# Patient Record
Sex: Female | Born: 1990 | Race: White | Hispanic: No | Marital: Single | Smoking: Never smoker
Health system: Southern US, Community
[De-identification: ages and names within clinical notes are randomized; demographics above are authoritative.]

## PROBLEM LIST (undated history)

## (undated) HISTORY — PX: TONSILLECTOMY: SUR1361

## (undated) HISTORY — PX: COLPOSCOPY: SHX161

---

## 2013-11-24 ENCOUNTER — Encounter (HOSPITAL_COMMUNITY): Payer: Self-pay | Admitting: Emergency Medicine

## 2013-11-24 ENCOUNTER — Emergency Department (HOSPITAL_COMMUNITY)
Admission: EM | Admit: 2013-11-24 | Discharge: 2013-11-24 | Disposition: A | Discharge status: Home / Self Care | Payer: Medicaid Other | Attending: Emergency Medicine | Admitting: Emergency Medicine

## 2013-11-24 DIAGNOSIS — L500 Allergic urticaria: Secondary | ICD-10-CM | POA: Insufficient documentation

## 2013-11-24 DIAGNOSIS — T4995XA Adverse effect of unspecified topical agent, initial encounter: Secondary | ICD-10-CM | POA: Insufficient documentation

## 2013-11-24 DIAGNOSIS — T7840XA Allergy, unspecified, initial encounter: Secondary | ICD-10-CM

## 2013-11-24 DIAGNOSIS — R21 Rash and other nonspecific skin eruption: Secondary | ICD-10-CM | POA: Insufficient documentation

## 2013-11-24 MED ORDER — PREDNISONE 20 MG PO TABS: 40.0000 mg | ORAL_TABLET | Freq: Every day | ORAL | Status: DC

## 2013-11-24 MED ORDER — FAMOTIDINE 40 MG PO TABS: 20.0000 mg | ORAL_TABLET | Freq: Two times a day (BID) | ORAL | Status: DC

## 2013-11-24 MED ORDER — FAMOTIDINE 20 MG PO TABS
20.0000 mg | ORAL_TABLET | Freq: Once | ORAL | Status: AC
  Administered 2013-11-24: 20 mg via ORAL
  Filled 2013-11-24: qty 1

## 2013-11-24 MED ORDER — METHYLPREDNISOLONE SODIUM SUCC 125 MG IJ SOLR
125.0000 mg | Freq: Once | INTRAMUSCULAR | Status: AC
  Administered 2013-11-24: 125 mg via INTRAMUSCULAR
  Filled 2013-11-24: qty 2

## 2013-11-24 MED ORDER — DIPHENHYDRAMINE HCL 50 MG/ML IJ SOLN
50.0000 mg | Freq: Once | INTRAMUSCULAR | Status: AC
  Administered 2013-11-24: 50 mg via INTRAMUSCULAR
  Filled 2013-11-24: qty 1

## 2013-11-24 MED ORDER — DIPHENHYDRAMINE HCL 25 MG PO TABS: 25.0000 mg | ORAL_TABLET | Freq: Four times a day (QID) | ORAL | Status: DC | PRN

## 2013-11-24 NOTE — ED Notes (Signed)
Pt reports rash that started 2 days ago. States she was seen at [facility] and was told it was scabies. States she has been using prescription with no relief. Denies pain, but reports extreme itching. Rash to neck, abdomen, arms and legs. NAD.

## 2013-11-24 NOTE — ED Provider Notes (Signed)
CSN: [id number]     Arrival date & time 11/24/13  2127 History  This chart was scribed for non-physician practitioner, Junious Silk, PA-C working with Layla Maw Ward, DO by Greggory Stallion, ED scribe. This patient was seen in room TR08C/TR08C and the patient's care was started at 10:33 PM.    Chief Complaint  Patient presents with  . Rash   The history is provided by the patient. No language interpreter was used.   HPI Comments: Mercedes Peterson is a 23 y.o. female who presents to the Emergency Department complaining of gradual onset , worsening, itchy rash that stared 2 days ago. States it is to her neck, arms, abdomen and legs. Pt states she was seen at [facility] and told it was scabies. Pt has been using the premethrin cream she was given with no relief. No one else at home has a similar rash. She states that her throat started to feel like it was closing up the morning after she started the cream. She does not currently have this feeling and she does not feel short of breath. Pt has also taken benadryl with no relief. Her last dose was yesterday night. Denies new soaps, detergents, pets. Pt has not slept in a new place. Denies trouble swallowing, difficulty breathing.   History reviewed. No pertinent past medical history. History reviewed. No pertinent past surgical history. No family history on file. History  Substance Use Topics  . Smoking status: Never Smoker   . Smokeless tobacco: Not on file  . Alcohol Use: No   OB History   Grav Para Term Preterm Abortions TAB SAB Ect Mult Living                 Review of Systems  HENT: Negative for trouble swallowing.   Skin: Positive for rash.  All other systems reviewed and are negative.  Allergies  Review of patient's allergies indicates no known allergies.  Home Medications   Prior to Admission medications   Not on File   BP 118/65  Pulse 77  Temp(Src) 98.6 F (37 C) (Oral)  Resp 20  SpO2 100%  Physical Exam  Nursing note and  vitals reviewed. Constitutional: She is oriented to person, place, and time. She appears well-developed and well-nourished. No distress.  HENT:  Head: Normocephalic and atraumatic.  Right Ear: External ear normal.  Left Ear: External ear normal.  Nose: Nose normal.  Mouth/Throat: Oropharynx is clear and moist.  Speaking in full sentences and maintaining own secretions. No uvula swelling.   Eyes: Conjunctivae are normal.  Neck: Normal range of motion.  Cardiovascular: Normal rate, regular rhythm and normal heart sounds.   Pulmonary/Chest: Effort normal and breath sounds normal. No stridor. No respiratory distress. She has no wheezes. She has no rales.  Abdominal: Soft. She exhibits no distension.  Musculoskeletal: Normal range of motion.  Neurological: She is alert and oriented to person, place, and time. She has normal strength.  Skin: Skin is warm and dry. Rash noted. She is not diaphoretic. No erythema.  Urticarial rash throughout upper extremities, abdomen, chest and back. No rashes in webspace of fingers.   Psychiatric: She has a normal mood and affect. Her behavior is normal.    ED Course  Procedures (including critical care time)  DIAGNOSTIC STUDIES: Oxygen Saturation is 100% on RA, normal by my interpretation.    COORDINATION OF CARE: 10:36 PM-Discussed treatment plan which includes a steroid, benadryl and pepcid with pt at bedside and pt agreed to plan.  Labs Review Labs Reviewed - No data to display  Imaging Review No results found.   EKG Interpretation None      MDM   Final diagnoses:  Allergic reaction, initial encounter   Patient evaluated prior to dc, is hemodynamically stable, in no respiratory distress, and denies the feeling of throat closing. Pt has been advised to take OTC benadryl & return to the ED if they have a mod-severe allergic rxn (s/s including throat closing, difficulty breathing, swelling of lips face or tongue). She was given prednisone,  pepcid, and benadryl rx to use at home. Pt is to follow up with their PCP. Pt is agreeable with plan & verbalizes understanding.  I personally performed the services described in this documentation, which was scribed in my presence. The recorded information has been reviewed and is accurate.  Mora Bellman, PA-C 11/25/13 [phone]

## 2013-11-24 NOTE — Discharge Instructions (Signed)

## 2013-11-25 NOTE — ED Provider Notes (Signed)
Medical screening examination/treatment/procedure(s) were performed by non-physician practitioner and as supervising physician I was immediately available for consultation/collaboration.   EKG Interpretation None        Layla Maw Carmella Kees, DO 11/25/13 [id number]

## 2014-11-09 ENCOUNTER — Emergency Department (HOSPITAL_COMMUNITY)
Admission: EM | Admit: 2014-11-09 | Discharge: 2014-11-09 | Disposition: A | Discharge status: Home / Self Care | Payer: Medicaid Other

## 2014-11-09 NOTE — ED Notes (Signed)
Patient states she does not want to wait, will follow up with PCP. Ambulatory with steady gait, NAD

## 2016-01-02 ENCOUNTER — Encounter (HOSPITAL_COMMUNITY): Payer: Self-pay | Admitting: *Deleted

## 2016-01-19 ENCOUNTER — Ambulatory Visit (HOSPITAL_COMMUNITY): Payer: Medicaid Other

## 2016-01-23 ENCOUNTER — Encounter: Payer: Medicaid Other | Admitting: Obstetrics and Gynecology

## 2016-01-26 ENCOUNTER — Ambulatory Visit (HOSPITAL_COMMUNITY): Payer: Medicaid Other

## 2016-03-22 ENCOUNTER — Ambulatory Visit (HOSPITAL_COMMUNITY)
Admission: RE | Admit: 2016-03-22 | Discharge: 2016-03-22 | Disposition: A | Discharge status: Home / Self Care | Payer: No Typology Code available for payment source | Source: Ambulatory Visit | Attending: Obstetrics and Gynecology | Admitting: Obstetrics and Gynecology

## 2016-03-22 ENCOUNTER — Other Ambulatory Visit: Payer: Self-pay | Admitting: Obstetrics and Gynecology

## 2016-03-22 ENCOUNTER — Encounter (HOSPITAL_COMMUNITY): Payer: Self-pay | Admitting: *Deleted

## 2016-03-22 VITALS — BP 102/60 | Temp 98.4°F | Ht 65.0 in | Wt 146.6 lb

## 2016-03-22 DIAGNOSIS — N6452 Nipple discharge: Secondary | ICD-10-CM

## 2016-03-22 DIAGNOSIS — R87611 Atypical squamous cells cannot exclude high grade squamous intraepithelial lesion on cytologic smear of cervix (ASC-H): Secondary | ICD-10-CM

## 2016-03-22 DIAGNOSIS — Z1239 Encounter for other screening for malignant neoplasm of breast: Secondary | ICD-10-CM

## 2016-03-22 NOTE — Progress Notes (Signed)
Patient referred to [facility] by the [facility] due to having an abnormal Pap smear on 12/20/2015 that a colposcopy is recommended for follow up. Patient complained of bilateral milky to clear colored discharge when expresses.  Pap Smear:  Pap smear not completed today. Last Pap smear was 12/20/2015 [facility]. Patient referred to the [facility] for a colpscopy. Appointment scheduled for Tuesday, March 27, 2016 at 1040. Per patient has a history of two or three other abnormal Pap smears. Patient stated she has had one colposcopy completed two years ago for follow up. Last Pap smear result is in EPIC.  Physical exam: Breasts Breasts symmetrical. No skin abnormalities bilateral breasts. No nipple retraction bilateral breasts. Bilateral clear nipple discharge expressed on exam. Not able to express enough nipple discharge from either breast to send sample to Cytology. No lymphadenopathy. No lumps palpated bilateral breasts. No complaints of pain or tenderness on exam. [facility] breast [facility]. Appointment scheduled for Monday, April 09, 2016 at 1000.       Pelvic/Bimanual No Pap smear completed today since last Pap smear was 12/20/2015. Pap smear not indicated per BCCCP guidelines.   Smoking History: Patient has never smoked.  Patient Navigation: Patient education provided. Access to services provided for patient through [facility].

## 2016-03-22 NOTE — Patient Instructions (Signed)
Explained breast self awareness to Mercedes Peterson. Patient did not need a Pap smear today due to last Pap smear was 12/20/2015. Explained the colposcopy the recommended follow up for her abnormal Pap smear.  [facility] for a colps[facility]. Appointment scheduled for Tuesday, March 27, 2016 at 1040. Ref[facility] for bilateral breast ultrasounds. Appointment scheduled for Monday, April 09, 2016 at 1000. Patient aware of appointments and will be there. Mercedes Peterson verbalized understanding.  Mercedes Peterson, Kathaleen Maserhristine Poll, RN 3:58 PM

## 2016-03-27 ENCOUNTER — Ambulatory Visit (INDEPENDENT_AMBULATORY_CARE_PROVIDER_SITE_OTHER): Payer: Medicaid Other | Admitting: Family Medicine

## 2016-03-27 ENCOUNTER — Encounter: Payer: Self-pay | Admitting: Family Medicine

## 2016-03-27 ENCOUNTER — Other Ambulatory Visit (HOSPITAL_COMMUNITY)
Admission: RE | Admit: 2016-03-27 | Discharge: 2016-03-27 | Disposition: A | Discharge status: Home / Self Care | Payer: Medicaid Other | Source: Ambulatory Visit | Attending: Family Medicine | Admitting: Family Medicine

## 2016-03-27 VITALS — BP 105/58 | HR 63 | Ht 65.0 in | Wt 145.5 lb

## 2016-03-27 DIAGNOSIS — R87810 Cervical high risk human papillomavirus (HPV) DNA test positive: Secondary | ICD-10-CM | POA: Insufficient documentation

## 2016-03-27 DIAGNOSIS — Z3202 Encounter for pregnancy test, result negative: Secondary | ICD-10-CM

## 2016-03-27 DIAGNOSIS — D069 Carcinoma in situ of cervix, unspecified: Secondary | ICD-10-CM | POA: Insufficient documentation

## 2016-03-27 DIAGNOSIS — R87610 Atypical squamous cells of undetermined significance on cytologic smear of cervix (ASC-US): Secondary | ICD-10-CM | POA: Diagnosis not present

## 2016-03-27 LAB — POCT PREGNANCY, URINE: Preg Test, Ur: NEGATIVE

## 2016-03-27 NOTE — Progress Notes (Signed)
GYNECOLOGY CLINIC COLPOSCOPY VISIT AND PROCEDURE NOTE  25 y.o. G2P0 here for colposcopy for ASCUS with POSITIVE high risk HPV pap smear on 12/20/15. Prior cervical cytology and/or colposcopy findings: abnormal, had history of CIN III, but never proceeded to LEEP as recommended. The patient reports the following prior treatments to the vulva/vagina/cervix: None. The patient is not a cigarette smoker. The patient is not immunosuppressed. The patient is not pregnant. The patient is not taking anticoagulants and is not allergic to iodine.  The risks (including infection, bleeding, pain) and benefits of the procedure were explained to the patient and written informed consent was obtained. Patient given informed consent, signed copy in the chart, time out was performed. Urine pregnancy test performed and confirmed to be negative.  Placed in lithotomy position. Repeat cervical cytology was obtained due to previous pap 3 years ago showed CIN 3 and recent pap was ASCUS only.   Gross findings:   Vagina: Normal appearance  Vulva: Normal appearance  Cervix: Nulliparous; transitional zone seen.  Visualization after:  Acetic acid: +Sharp acetowhite borders. Punctation seen. No abnormal vessels  Green or blue filter: Same   Upper one-third of vagina examined, revealing: Normal mucosa  Biopsies obtained: 12 o'clock and 7 o'clock  ECC specimen obtained: YES  Colposcopy adequate? Yes  All specimens were labelled and sent to pathology.   Patient was given post procedure instructions.  Will follow up pathology and manage accordingly.      Mercedes MowElizabeth Babak Lucus, DO OB/GYN Fellow Center [facility] Midwife(Faculty Practice)

## 2016-03-28 LAB — CYTOLOGY - PAP: Diagnosis: HIGH — AB

## 2016-03-30 ENCOUNTER — Telehealth: Payer: Self-pay | Admitting: *Deleted

## 2016-03-30 NOTE — Telephone Encounter (Signed)
-----   Message from [facility] Mumaw, DO sent at 03/29/2016  7:24 PM EST ----- Please call patient regarding test results for CIN 3 on colposcopy and repeat pap. Please have her come back for LEEP/conization procedure or consultation if she wants to discuss before having it performed. If possible please set it up on a day when I am able to do it with an attending provider in office too. Thank you!  Dr Omer JackMumaw

## 2016-03-30 NOTE — Telephone Encounter (Signed)
Attempted to call patient. No answer, left vm stating I am calling with test results, please return my call at the clinic.

## 2016-04-06 ENCOUNTER — Encounter (HOSPITAL_COMMUNITY): Payer: Self-pay | Admitting: *Deleted

## 2016-04-06 NOTE — Telephone Encounter (Addendum)
Lm for pt to return call back to the office.   Letter sent.

## 2016-04-09 ENCOUNTER — Other Ambulatory Visit: Payer: No Typology Code available for payment source

## 2016-05-07 ENCOUNTER — Ambulatory Visit (INDEPENDENT_AMBULATORY_CARE_PROVIDER_SITE_OTHER): Payer: Medicaid Other | Admitting: Family Medicine

## 2016-05-07 ENCOUNTER — Encounter: Payer: Self-pay | Admitting: Family Medicine

## 2016-05-07 ENCOUNTER — Telehealth (HOSPITAL_COMMUNITY): Payer: Self-pay | Admitting: *Deleted

## 2016-05-07 VITALS — BP 104/65 | HR 72 | Ht 65.0 in | Wt 146.7 lb

## 2016-05-07 DIAGNOSIS — N898 Other specified noninflammatory disorders of vagina: Secondary | ICD-10-CM

## 2016-05-07 DIAGNOSIS — D069 Carcinoma in situ of cervix, unspecified: Secondary | ICD-10-CM | POA: Diagnosis not present

## 2016-05-07 DIAGNOSIS — Z113 Encounter for screening for infections with a predominantly sexual mode of transmission: Secondary | ICD-10-CM | POA: Diagnosis not present

## 2016-05-07 LAB — POCT PREGNANCY, URINE: PREG TEST UR: NEGATIVE

## 2016-05-07 NOTE — Telephone Encounter (Signed)
After reviewing patients chart she is eligible for BCC[facility] Medicaid due to CIN III on her colposcopy with a LEEP scheduled for follow up. Explained to patient if she can come complete her paperwork then it will cover her previous bills received. Transferred [place] to [place] to work out a time to complete her paperwork. Patient verbalized understanding.

## 2016-05-07 NOTE — Assessment & Plan Note (Signed)
Declined LEEP today-procedure discussed in detail including need for intervention. She was shown the video today as well. To scheduled f/u.

## 2016-05-07 NOTE — Telephone Encounter (Signed)
Patient faxed two bills to [facility]CP to check if covered. The one for [facility] is for a Pap smear and she had a colposcopy completed on 03/27/16. Have sent an e-mail to [facility] to fix bill. Told patient the colposcopy and biopsy are covered by [facility]CP. For the bill for $16 from [facility]. Told her that it is for the pregnancy test completed prior to the colposcopy. Informed her that it is standard of care to complete prior to colposcopy and that it is not covered by BCCCP. Let her know she will need to pay that bill and can call the number on the bill if needs assistance. Patient verbalized understanding.

## 2016-05-07 NOTE — Patient Instructions (Signed)

## 2016-05-07 NOTE — Progress Notes (Signed)
   Subjective:    Patient ID: Mercedes Peterson is a 26 y.o. female presenting with Follow-up  on 05/07/2016  HPI: Has h/o CIN3 on colposcopy. Scheduled for LEEP, but she does not want this today. Reports vaginal discharge since her colposcopy. Recent [facility] for GC[facility]Chlam [facility]. No new partner.  Review of Systems  Constitutional: Negative for chills and fever.  Respiratory: Negative for shortness of breath.   Cardiovascular: Negative for chest pain.  Gastrointestinal: Negative for abdominal pain, nausea and vomiting.  Genitourinary: Positive for vaginal discharge. Negative for dysuria.  Skin: Negative for rash.      Objective:    BP 104/65   Pulse 72   Ht 5\' 5"  (1.651 m)   Wt 146 lb 11.2 oz (66.5 kg)   BMI 24.41 kg/m  Physical Exam  Constitutional: She is oriented to person, place, and time. She appears well-developed and well-nourished. No distress.  HENT:  Head: Normocephalic and atraumatic.  Eyes: No scleral icterus.  Neck: Neck supple.  Cardiovascular: Normal rate.   Pulmonary/Chest: Effort normal.  Abdominal: Soft.  Neurological: She is alert and oriented to person, place, and time.  Skin: Skin is warm and dry.  Psychiatric: She has a normal mood and affect.        Assessment & Plan:   Problem List Items Addressed This Visit      Unprioritized   Cervical intraepithelial neoplasia grade III with severe dysplasia    Declined LEEP today-procedure discussed in detail including need for intervention. She was shown the video today as well. To scheduled f/u.       Other Visit Diagnoses    Vaginal discharge    -  Primary   discussed getting cultures, treatment when results come in.   Relevant Orders   Cervicovaginal ancillary only      Total face-to-face time with patient: 15 minutes. Over 50% of encounter was spent on counseling and coordination of care. No Follow-up on file.  Reva Boresanya S Blessyn [place] 05/07/2016 10:52 AM

## 2016-05-08 LAB — CERVICOVAGINAL ANCILLARY ONLY
Bacterial vaginitis: POSITIVE — AB
CHLAMYDIA, DNA PROBE: NEGATIVE
Candida vaginitis: NEGATIVE
Neisseria Gonorrhea: NEGATIVE
Trichomonas: NEGATIVE

## 2016-05-09 ENCOUNTER — Telehealth: Payer: Self-pay | Admitting: *Deleted

## 2016-05-09 MED ORDER — METRONIDAZOLE 500 MG PO TABS: 500.0000 mg | ORAL_TABLET | Freq: Two times a day (BID) | ORAL | 0 refills | Status: DC

## 2016-05-09 NOTE — Telephone Encounter (Signed)
Called pt and advised her of +BV per wet prep. All other tests for infection were negative. Rx has been sent to her pharmacy.  Pt voiced understanding and had no questions.

## 2016-06-07 ENCOUNTER — Ambulatory Visit (INDEPENDENT_AMBULATORY_CARE_PROVIDER_SITE_OTHER): Payer: Medicaid Other | Admitting: Family Medicine

## 2016-06-07 ENCOUNTER — Encounter: Payer: Self-pay | Admitting: Family Medicine

## 2016-06-07 ENCOUNTER — Other Ambulatory Visit (HOSPITAL_COMMUNITY)
Admission: RE | Admit: 2016-06-07 | Discharge: 2016-06-07 | Disposition: A | Discharge status: Home / Self Care | Payer: Medicaid Other | Source: Ambulatory Visit | Attending: Family Medicine | Admitting: Family Medicine

## 2016-06-07 VITALS — BP 107/59 | HR 67 | Wt 148.0 lb

## 2016-06-07 DIAGNOSIS — Z3202 Encounter for pregnancy test, result negative: Secondary | ICD-10-CM

## 2016-06-07 DIAGNOSIS — R87613 High grade squamous intraepithelial lesion on cytologic smear of cervix (HGSIL): Secondary | ICD-10-CM | POA: Diagnosis not present

## 2016-06-07 LAB — POCT PREGNANCY, URINE: Preg Test, Ur: NEGATIVE

## 2016-06-07 NOTE — Patient Instructions (Signed)
Cervical Conization, Care After °This sheet gives you information about how to care for yourself after your procedure. Your doctor may also give you more specific instructions. If you have problems or questions, contact your doctor. °Follow these instructions at home: °Medicines °· Take over-the-counter and prescription medicines only as told by your doctor. °· Do not take aspirin until your doctor says it is okay. °· If you take pain medicine: °? You may have constipation. To help treat this, your doctor may tell you to: °§ Drink enough fluid to keep your pee (urine) clear or pale yellow. °§ Take medicines. °§ Eat foods that are high in fiber. These include fresh fruits and vegetables, whole grains, bran, and beans. °§ Limit foods that are high in fat and sugar. These include fried foods and sweet foods. °? Do not drive or use heavy machines. °General instructions °· You can eat your usual diet unless your doctor tells you not to do so. °· Take showers for the first week. Do not take baths, swim, or use hot tubs until your doctor says it is okay. °· Do not douche, use tampons, or have sex until your doctor says it is okay. °· For 7-14 days after your procedure, avoid: °? Being very active. °? Exercising. °? Heavy lifting. °· Keep all follow-up visits as told by your doctor. This is important. °Contact a doctor if: °· You have a rash. °· You are dizzy or lightheaded. °· You feel sick to your stomach (nauseous). °· You throw up (vomit). °· You have fluid from your vagina (vaginal discharge) that smells bad. °Get help right away if: °· There are blood clots coming from your vagina. °· You have more bleeding than you would have in a normal period. For example, you soak a pad in less than 1 hour. °· You have a fever. °· You have more and more cramps. °· You pass out (faint). °· You have pain when peeing. °· Your have a lot of pain. °· Your pain gets worse. °· Your pain does not get better when you take your  medicine. °· You have blood in your pee. °· You throw up (vomit). °Summary °· After your procedure, take over-the-counter and prescription medicines only as told by your doctor. °· Do not douche, use tampons, or have sex until your doctor says it is okay. °· For about 7-14 days after your procedure, try not to exercise or lift heavy objects. °· Get help right away if you have new symptoms, or if your symptoms become worse. °This information is not intended to replace advice given to you by your health care provider. Make sure you discuss any questions you have with your health care provider. °Document Released: 12/27/2007 Document Revised: 03/21/2016 Document Reviewed: 03/21/2016 °Elsevier Interactive Patient Education © 2017 Elsevier Inc. ° °

## 2016-06-08 NOTE — Progress Notes (Signed)
   GYNECOLOGY OFFICE PROCEDURE NOTE  Mercedes Peterson is a 26 y.o. G2P0 here for LEEP. No GYN concerns. Pap smear and colposcopy reviewed.    Pap CIN 2-3 03/27/16 Colpo Biopsy CIN 3 ECC benign  Risks, benefits, alternatives, and limitations of procedure explained to patient, including pain, bleeding, infection, failure to remove abnormal tissue and failure to cure dysplasia, need for repeat procedures, damage to pelvic organs, cervical incompetence.  Role of HPV,cervical dysplasia and need for close followup was empasized. Informed written consent was obtained. All questions were answered. Time out performed. Urine pregnancy test was negative.  Procedure: The patient was placed in lithotomy position and the bivalved coated speculum was placed in the patient's vagina. A grounding pad placed on the patient. Colposcopy performed. Diffuse aceto-white areas noted.  Local anesthesia was administered via an intracervical block using 20cc of 2% Lidocaine with epinephrine. The suction was turned on and the Medium 1X Fisher Cone Biopsy Excisor on 50 Watts of cutting current was used to excise the area of decreased uptake and excise the entire transformation zone. Roller ball coagulation set at ONEOK50 Watts coagulation current applied to the base. Hemostasis eventually obtained with Monsel's. The speculum was removed from the vagina. Specimens were sent to pathology.  The patient tolerated the procedure well. Post-operative instructions given to patient, including instruction to seek medical attention for persistent bright red bleeding, fever, abdominal/pelvic pain, dysuria, nausea or vomiting. She was also told about the possibility of having copious yellow to black tinged discharge for weeks. She was counseled to avoid anything in the vagina (sex/douching/tampons) for 3 weeks.  F/u sampling based on pathology results.     Mercedes Peterson

## 2016-06-12 ENCOUNTER — Encounter: Payer: Self-pay | Admitting: *Deleted

## 2016-06-12 ENCOUNTER — Telehealth: Payer: Self-pay | Admitting: *Deleted

## 2016-06-12 NOTE — Telephone Encounter (Signed)
Attempted to call with results, there was no answer. VM left stating I am calling with test results, please return my call at the clinic.

## 2016-06-12 NOTE — Telephone Encounter (Signed)
-----   Message from Reva Boresanya S Pratt, MD sent at 06/12/2016  2:17 PM EDT ----- Her margins are clear--we will repeat pap in 1 year.

## 2016-06-13 NOTE — Telephone Encounter (Signed)
Pt returned call and I informed her that her LEEP results is that her margins are clear and the provider recommended her to have a pap smear in one year.  Pt stated understanding with further questions.

## 2016-09-10 ENCOUNTER — Ambulatory Visit (INDEPENDENT_AMBULATORY_CARE_PROVIDER_SITE_OTHER): Payer: Medicaid Other | Admitting: Family Medicine

## 2016-09-10 ENCOUNTER — Encounter: Payer: Self-pay | Admitting: Family Medicine

## 2016-09-10 ENCOUNTER — Other Ambulatory Visit (HOSPITAL_COMMUNITY)
Admission: RE | Admit: 2016-09-10 | Discharge: 2016-09-10 | Disposition: A | Discharge status: Home / Self Care | Payer: Medicaid Other | Source: Ambulatory Visit | Attending: Family Medicine | Admitting: Family Medicine

## 2016-09-10 VITALS — BP 111/63 | HR 66 | Wt 143.0 lb

## 2016-09-10 DIAGNOSIS — Z113 Encounter for screening for infections with a predominantly sexual mode of transmission: Secondary | ICD-10-CM | POA: Diagnosis not present

## 2016-09-10 DIAGNOSIS — B9689 Other specified bacterial agents as the cause of diseases classified elsewhere: Secondary | ICD-10-CM | POA: Insufficient documentation

## 2016-09-10 DIAGNOSIS — N760 Acute vaginitis: Secondary | ICD-10-CM | POA: Diagnosis not present

## 2016-09-10 DIAGNOSIS — R102 Pelvic and perineal pain: Secondary | ICD-10-CM | POA: Diagnosis present

## 2016-09-10 NOTE — Progress Notes (Signed)
   Subjective:    Patient ID: Mercedes Peterson is a 26 y.o. female presenting with Pelvic Pain  on 09/10/2016  HPI: Pain is left sided and is constant. Notes lower back pain. Some nausea. Pain has been going on since 4-5 wks post leep.Vaginal discharge initially but went away. Pain radiates from lower abdomen to upper abdomen. Taking Ibuprofen and heating pad, helps some. No change with movement, food. No cycle due to Depo.  Review of Systems  Constitutional: Negative for chills and fever.  Respiratory: Negative for shortness of breath.   Cardiovascular: Negative for chest pain.  Gastrointestinal: Negative for abdominal pain, nausea and vomiting.  Genitourinary: Positive for pelvic pain. Negative for dysuria.  Skin: Negative for rash.      Objective:    BP 111/63   Pulse 66   Wt 143 lb (64.9 kg)   BMI 23.80 kg/m  Physical Exam  Constitutional: She is oriented to person, place, and time. She appears well-developed and well-nourished. No distress.  HENT:  Head: Normocephalic and atraumatic.  Eyes: No scleral icterus.  Neck: Neck supple.  Cardiovascular: Normal rate.   Pulmonary/Chest: Effort normal.  Abdominal: Soft.  Genitourinary:  Genitourinary Comments: BUS normal, vagina is pink and rugated, cervix is nulliparous without lesion, uterus is small and anteverted, no right adnexal mass or tenderness, there is left sided fullness   Neurological: She is alert and oriented to person, place, and time.  Skin: Skin is warm and dry.  Psychiatric: She has a normal mood and affect.        Assessment & Plan:  Pelvic pain - not related to Depo or Leep-->fullness on left--check pelvic sono. - Plan: Cervicovaginal ancillary only, US Transvaginal Non-OB Check Cultures  Total face-to-face time with patient: 15 minutes. Over 50% of encounter was spent on counseling and coordination of care.  Reva Boresanya S Meade [place] 09/10/2016 8:48 AM

## 2016-09-11 LAB — CERVICOVAGINAL ANCILLARY ONLY
Bacterial vaginitis: NEGATIVE
Candida vaginitis: POSITIVE — AB
Chlamydia: NEGATIVE
NEISSERIA GONORRHEA: NEGATIVE
TRICH (WINDOWPATH): NEGATIVE

## 2016-09-12 ENCOUNTER — Telehealth: Payer: Self-pay | Admitting: General Practice

## 2016-09-12 DIAGNOSIS — B379 Candidiasis, unspecified: Secondary | ICD-10-CM

## 2016-09-12 MED ORDER — FLUCONAZOLE 150 MG PO TABS: 150.0000 mg | ORAL_TABLET | Freq: Once | ORAL | 0 refills | Status: AC

## 2016-09-12 NOTE — Telephone Encounter (Signed)
Called patient, no answer- left message to call us back concerning results. Will send letter.  

## 2016-09-12 NOTE — Telephone Encounter (Signed)
-----   Message from Reva Boresanya S Pratt, MD sent at 09/12/2016 11:02 AM EDT ----- Please let her know it appears she has overgrowth of yeast and treat if she has any symptoms->Diflucan 150 mg po daily

## 2016-09-12 NOTE — Telephone Encounter (Signed)
Patient called front office returning our phone call. I informed her of results and asked if she has been having symptoms of a yeast infection. Patient states no and it probably came from recent antibiotic usage. Told patient I sent the prescription in already so if she develops symptoms she may go by and pick up the medication. Patient verbalized understanding & had no questions

## 2016-09-13 ENCOUNTER — Ambulatory Visit (HOSPITAL_COMMUNITY)
Admission: RE | Admit: 2016-09-13 | Discharge: 2016-09-13 | Disposition: A | Discharge status: Home / Self Care | Payer: Medicaid Other | Source: Ambulatory Visit | Attending: Family Medicine | Admitting: Family Medicine

## 2016-09-13 DIAGNOSIS — R102 Pelvic and perineal pain: Secondary | ICD-10-CM | POA: Insufficient documentation

## 2016-09-19 ENCOUNTER — Telehealth: Payer: Self-pay

## 2016-09-19 NOTE — Telephone Encounter (Signed)
-----   Message from Tereso NewcomerUgonna A Anyanwu, MD sent at 09/13/2016 12:22 PM EDT ----- No GYN etiology seen for her pain, normal ultrasound.  Please call to inform patient of results.

## 2016-09-19 NOTE — Telephone Encounter (Signed)
Notified pt that US was normal and that there was no GYN issue for her pain.  Pt stated understanding and did not have any other questions.

## 2018-03-12 IMAGING — US US TRANSVAGINAL NON-OB
2 series · 15 of 25 positions shown · non-contrast
Comparison: None.

CLINICAL DATA: Left pelvic pain x1 month

EXAM:
ULTRASOUND PELVIS TRANSVAGINAL
TECHNIQUE: Transvaginal ultrasound examination of the pelvis was performed
including evaluation of the uterus, ovaries, adnexal regions, and
pelvic cul-de-sac.

[Series 1: us transvaginal non-ob · 14 of 36 slices shown (1 of 2)]
[im 1/36]
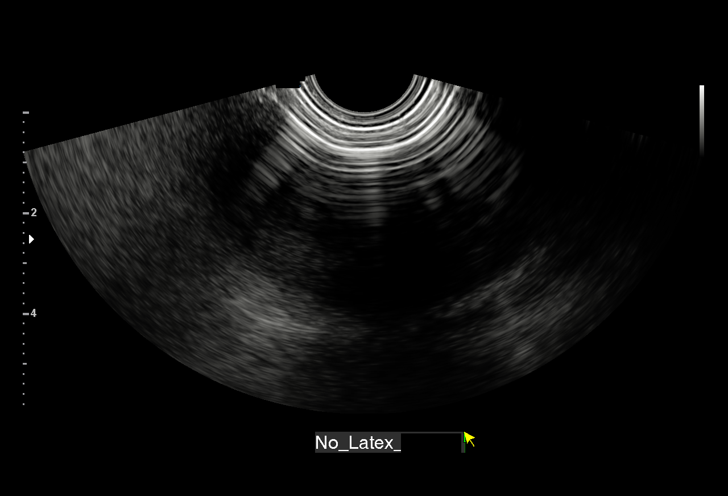
[im 4/36]
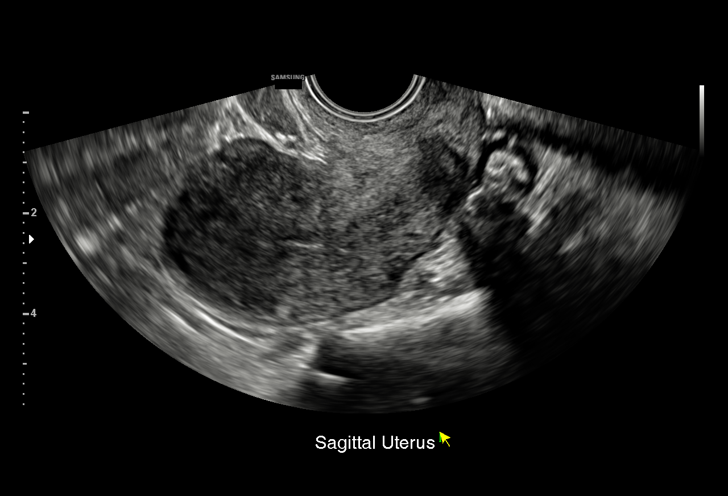
[im 7/36]
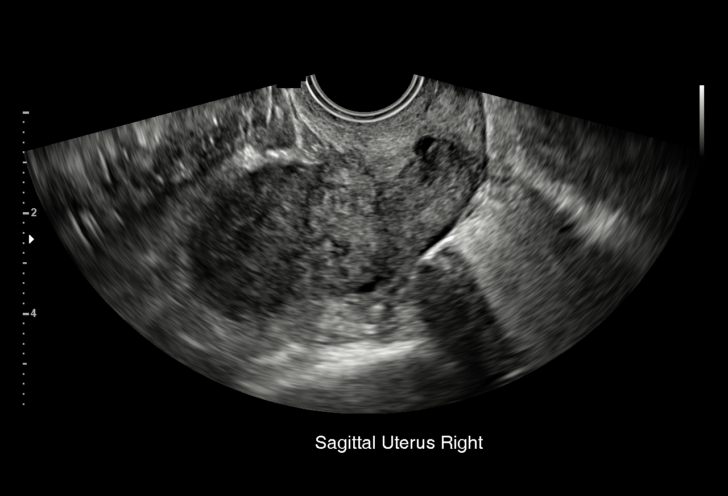
[im 8/36]
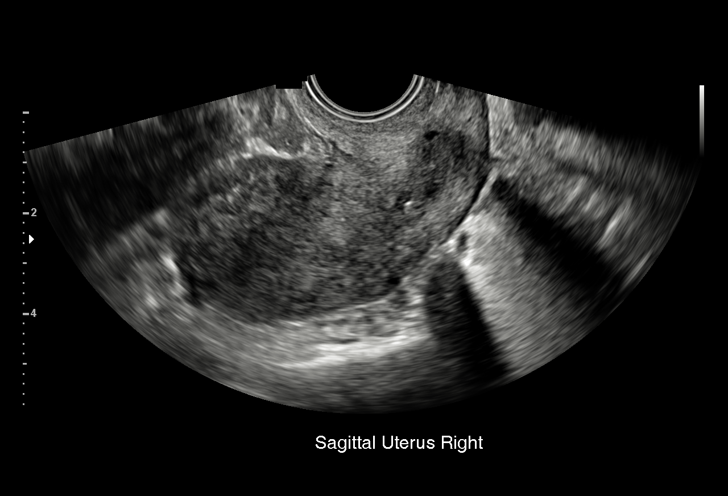
[im 11/36]
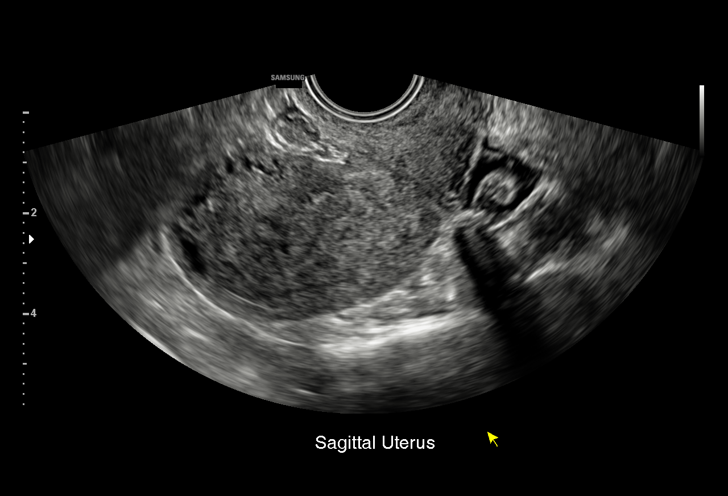
[im 14/36]
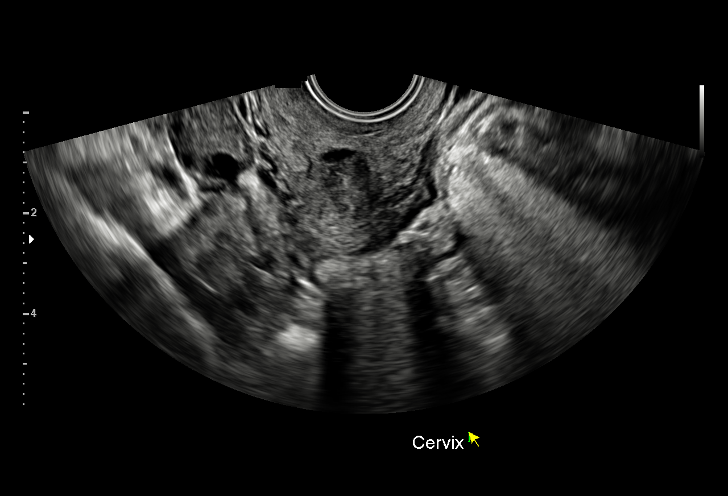
[im 16/36]
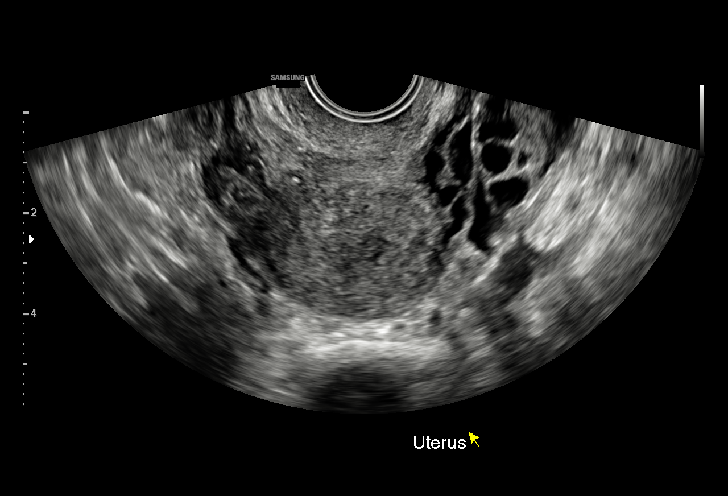
[im 19/36]
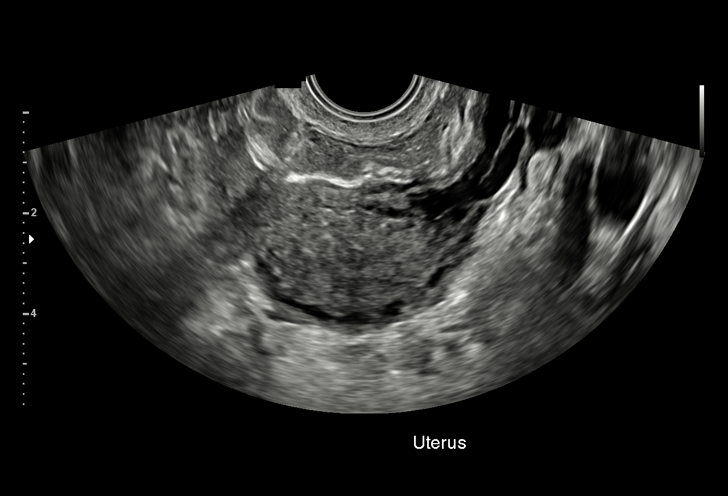
[im 22/36]
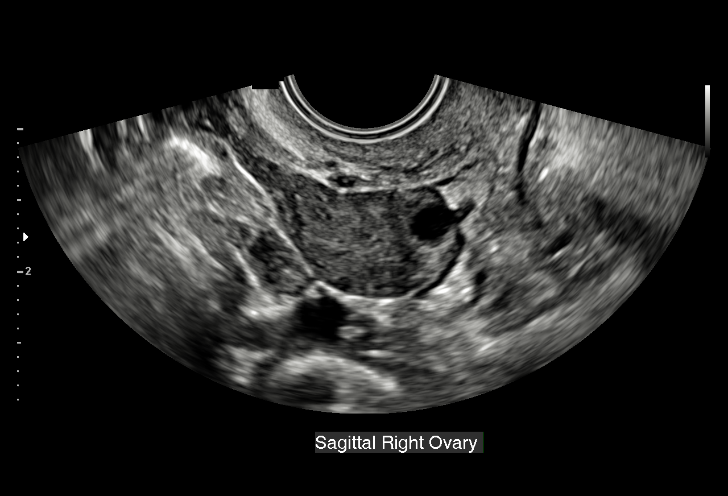
[im 23/36]
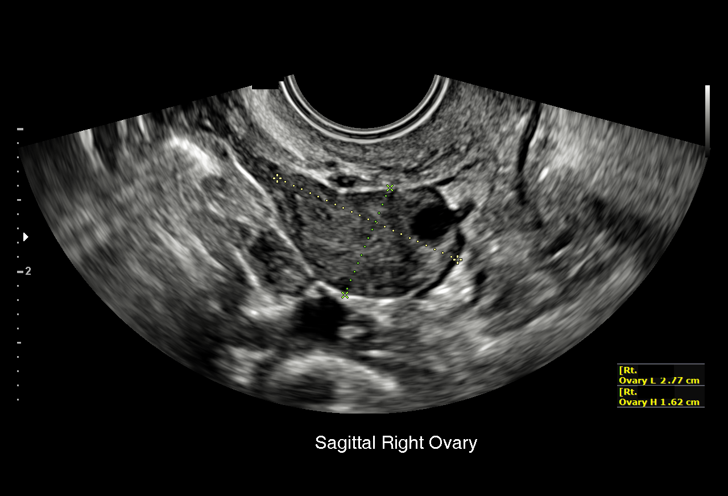
[im 26/36]
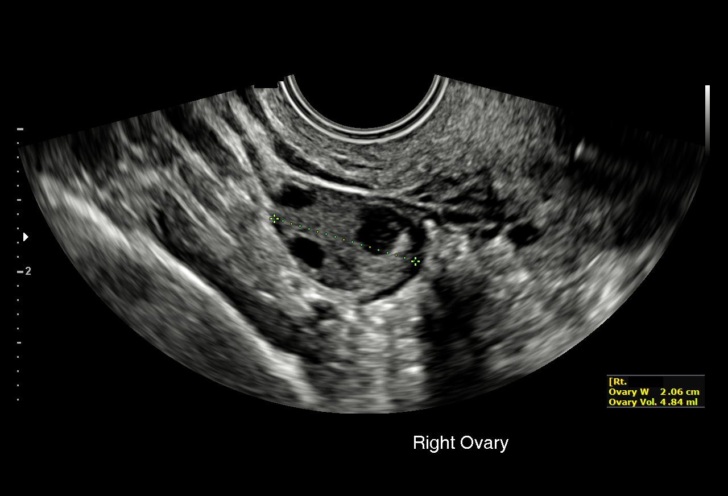
[im 29/36]
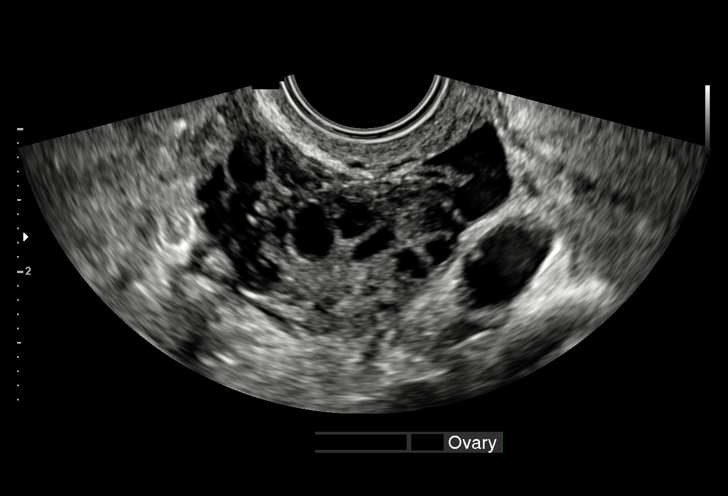
[im 31/36]
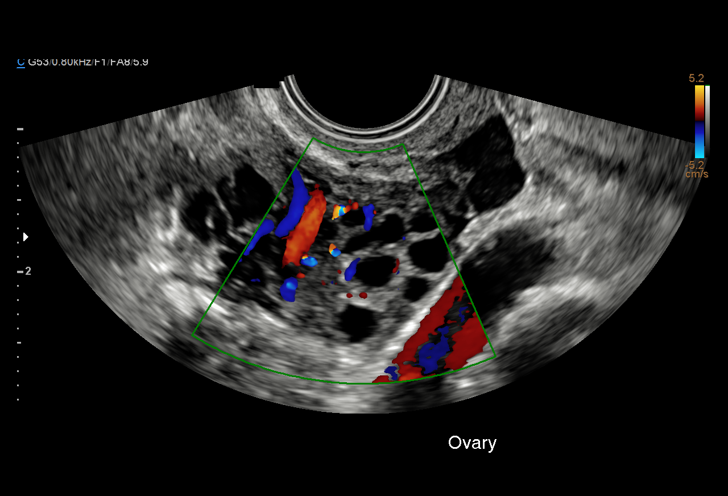
[im 34/36]
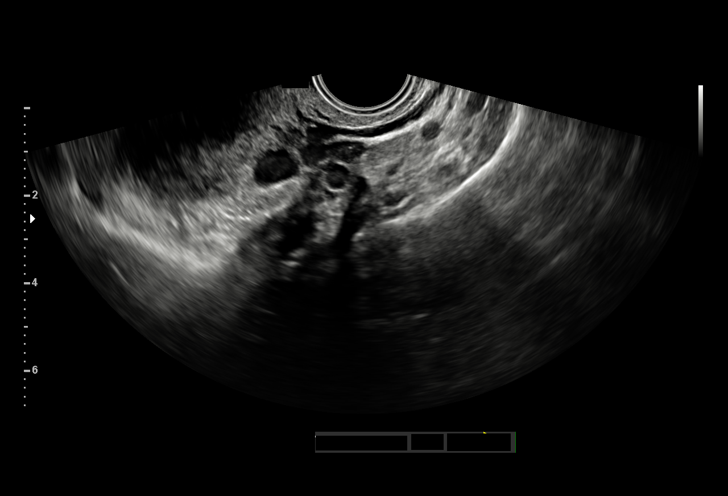

[Series 3: us transvaginal non-ob · 1 of 1 slices shown (2 of 2)]
[im 1/1]
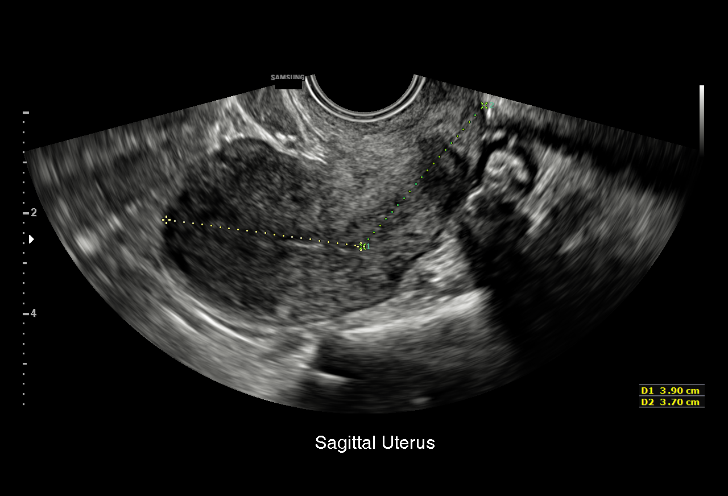

[15 of 25 positions shown; findings below may reference images not displayed]

FINDINGS: Uterus

Measurements: 7.6 x 3.8 x 4.7 cm. No fibroids or other mass
visualized.

Endometrium

Thickness: 2 mm.  No focal abnormality visualized.

Right ovary

Measurements: 2.8 x 1.6 x 2.1 cm. Normal appearance/no adnexal mass.

Left ovary

Measurements: 3.6 x 1.8 x 2.9 cm. Multiple small cysts/follicles.

Other findings:  No abnormal free fluid
IMPRESSION: Negative pelvic ultrasound.

## 2018-12-10 ENCOUNTER — Encounter (HOSPITAL_COMMUNITY): Payer: Self-pay | Admitting: Emergency Medicine

## 2018-12-10 ENCOUNTER — Emergency Department (HOSPITAL_COMMUNITY)
Admission: EM | Admit: 2018-12-10 | Discharge: 2018-12-11 | Discharge status: Against Medical Advice | Payer: Self-pay | Attending: Emergency Medicine | Admitting: Emergency Medicine

## 2018-12-10 ENCOUNTER — Other Ambulatory Visit: Payer: Self-pay

## 2018-12-10 DIAGNOSIS — Z5321 Procedure and treatment not carried out due to patient leaving prior to being seen by health care provider: Secondary | ICD-10-CM | POA: Insufficient documentation

## 2018-12-10 NOTE — ED Notes (Signed)
Pt stated she is leaving. 

## 2018-12-10 NOTE — ED Triage Notes (Signed)
Patient reports right facial numbness onset 3 days ago , no facial droop , speech clear/no facial asymmetry , no neuro deficits . Pt. suspects side effect of Prednisone prescribed for her rashes .
